# Patient Record
Sex: Male | Born: 1935 | Race: Black or African American | Hispanic: No | Marital: Married | Smoking: Never smoker
Health system: Southern US, Community
[De-identification: ages and names within clinical notes are randomized; demographics above are authoritative.]

## PROBLEM LIST (undated history)

## (undated) DIAGNOSIS — R0602 Shortness of breath: Secondary | ICD-10-CM

## (undated) DIAGNOSIS — K219 Gastro-esophageal reflux disease without esophagitis: Secondary | ICD-10-CM

## (undated) DIAGNOSIS — Z87442 Personal history of urinary calculi: Secondary | ICD-10-CM

## (undated) DIAGNOSIS — M1990 Unspecified osteoarthritis, unspecified site: Secondary | ICD-10-CM

## (undated) DIAGNOSIS — N400 Enlarged prostate without lower urinary tract symptoms: Secondary | ICD-10-CM

## (undated) DIAGNOSIS — I10 Essential (primary) hypertension: Secondary | ICD-10-CM

## (undated) DIAGNOSIS — R569 Unspecified convulsions: Secondary | ICD-10-CM

## (undated) HISTORY — PX: COLONOSCOPY: SHX174

---

## 2002-10-23 HISTORY — PX: COLON RESECTION: SHX5231

## 2006-09-18 ENCOUNTER — Ambulatory Visit: Payer: Self-pay | Admitting: Internal Medicine

## 2007-01-15 ENCOUNTER — Ambulatory Visit: Payer: Self-pay | Admitting: Internal Medicine

## 2007-04-29 ENCOUNTER — Emergency Department: Payer: Self-pay | Admitting: Emergency Medicine

## 2008-07-13 ENCOUNTER — Emergency Department: Payer: Self-pay | Admitting: Unknown Physician Specialty

## 2010-12-22 ENCOUNTER — Ambulatory Visit: Payer: Self-pay | Admitting: General Practice

## 2011-08-01 ENCOUNTER — Ambulatory Visit: Payer: Self-pay | Admitting: Radiation Oncology

## 2011-08-03 ENCOUNTER — Ambulatory Visit: Payer: Self-pay | Admitting: Radiation Oncology

## 2011-08-24 ENCOUNTER — Ambulatory Visit: Payer: Self-pay | Admitting: Radiation Oncology

## 2011-12-14 ENCOUNTER — Emergency Department: Payer: Self-pay | Admitting: Emergency Medicine

## 2013-05-22 ENCOUNTER — Emergency Department: Payer: Self-pay | Admitting: Internal Medicine

## 2013-06-10 ENCOUNTER — Ambulatory Visit: Payer: Self-pay | Admitting: Specialist

## 2013-08-20 ENCOUNTER — Other Ambulatory Visit: Payer: Self-pay | Admitting: Neurosurgery

## 2013-08-20 ENCOUNTER — Inpatient Hospital Stay
Admission: RE | Admit: 2013-08-20 | Discharge: 2013-08-20 | Disposition: A | Discharge status: Home / Self Care | Payer: Self-pay | Source: Ambulatory Visit | Attending: Neurosurgery | Admitting: Neurosurgery

## 2013-08-20 DIAGNOSIS — M549 Dorsalgia, unspecified: Secondary | ICD-10-CM

## 2013-08-21 ENCOUNTER — Ambulatory Visit
Admission: RE | Admit: 2013-08-21 | Discharge: 2013-08-21 | Disposition: A | Discharge status: Home / Self Care | Payer: Medicare Other | Source: Ambulatory Visit | Attending: Neurosurgery | Admitting: Neurosurgery

## 2013-08-21 DIAGNOSIS — M549 Dorsalgia, unspecified: Secondary | ICD-10-CM

## 2013-08-21 MED ORDER — METHYLPREDNISOLONE ACETATE 40 MG/ML INJ SUSP (RADIOLOG: 120.0000 mg | Freq: Once | INTRAMUSCULAR | Status: DC

## 2013-08-21 MED ORDER — IOHEXOL 180 MG/ML  SOLN: 1.0000 mL | Freq: Once | INTRAMUSCULAR | Status: AC | PRN

## 2013-09-26 ENCOUNTER — Other Ambulatory Visit: Payer: Self-pay | Admitting: Neurosurgery

## 2013-09-29 ENCOUNTER — Encounter (HOSPITAL_COMMUNITY): Payer: Self-pay | Admitting: *Deleted

## 2013-09-29 MED ORDER — CEFAZOLIN SODIUM-DEXTROSE 2-3 GM-% IV SOLR
2.0000 g | INTRAVENOUS | Status: AC
  Administered 2013-09-30: 2 g via INTRAVENOUS

## 2013-09-29 NOTE — Progress Notes (Addendum)
Patient will bring medications to the hospital with him- he read from a list to me .  The list did not have doses on it.  I instructed patient to take Amlodipine and Labetalol.  Pt reported that he is seen at the [facility] in [place], and has had an ECho , EKG, I sent a fax requesting any cardiac studies.

## 2013-09-29 NOTE — Progress Notes (Signed)
09/29/13 1824  OBSTRUCTIVE SLEEP APNEA  Have you ever been diagnosed with sleep apnea through a sleep study? Yes  If yes, do you have and use a CPAP or BPAP machine every night? 0  Do you snore loudly (loud enough to be heard through closed doors)?  0  Do you often feel tired, fatigued, or sleepy during the daytime? 1  Has anyone observed you stop breathing during your sleep? 0  Do you have, or are you being treated for high blood pressure? 1  BMI more than 35 kg/m2? 0  Age over 32 years old? 1  Neck circumference greater than 40 cm/18 inches? 0 (17.5)  Gender: 1  Obstructive Sleep Apnea Score 4  Score 4 or greater  Results sent to PCP

## 2013-09-30 ENCOUNTER — Inpatient Hospital Stay (HOSPITAL_COMMUNITY): Payer: Medicare Other

## 2013-09-30 ENCOUNTER — Encounter (HOSPITAL_COMMUNITY)
Admission: RE | Disposition: A | Discharge status: Home / Self Care | Payer: Self-pay | Source: Ambulatory Visit | Attending: Neurosurgery

## 2013-09-30 ENCOUNTER — Encounter (HOSPITAL_COMMUNITY): Payer: Medicare Other | Admitting: Certified Registered"

## 2013-09-30 ENCOUNTER — Observation Stay (HOSPITAL_COMMUNITY)
Admission: RE | Admit: 2013-09-30 | Discharge: 2013-09-30 | Disposition: A | Discharge status: Home / Self Care | Payer: Medicare Other | Source: Ambulatory Visit | Attending: Neurosurgery | Admitting: Neurosurgery

## 2013-09-30 ENCOUNTER — Inpatient Hospital Stay (HOSPITAL_COMMUNITY): Payer: Medicare Other | Admitting: Certified Registered"

## 2013-09-30 ENCOUNTER — Encounter (HOSPITAL_COMMUNITY): Payer: Self-pay | Admitting: Certified Registered"

## 2013-09-30 DIAGNOSIS — I10 Essential (primary) hypertension: Secondary | ICD-10-CM | POA: Insufficient documentation

## 2013-09-30 DIAGNOSIS — M7130 Other bursal cyst, unspecified site: Secondary | ICD-10-CM | POA: Insufficient documentation

## 2013-09-30 DIAGNOSIS — M129 Arthropathy, unspecified: Secondary | ICD-10-CM | POA: Insufficient documentation

## 2013-09-30 DIAGNOSIS — M47817 Spondylosis without myelopathy or radiculopathy, lumbosacral region: Principal | ICD-10-CM | POA: Insufficient documentation

## 2013-09-30 DIAGNOSIS — N400 Enlarged prostate without lower urinary tract symptoms: Secondary | ICD-10-CM | POA: Insufficient documentation

## 2013-09-30 DIAGNOSIS — M48062 Spinal stenosis, lumbar region with neurogenic claudication: Secondary | ICD-10-CM | POA: Diagnosis present

## 2013-09-30 DIAGNOSIS — K219 Gastro-esophageal reflux disease without esophagitis: Secondary | ICD-10-CM | POA: Insufficient documentation

## 2013-09-30 HISTORY — PX: LUMBAR LAMINECTOMY/DECOMPRESSION MICRODISCECTOMY: SHX5026

## 2013-09-30 HISTORY — DX: Gastro-esophageal reflux disease without esophagitis: K21.9

## 2013-09-30 HISTORY — DX: Shortness of breath: R06.02

## 2013-09-30 HISTORY — DX: Unspecified convulsions: R56.9

## 2013-09-30 HISTORY — DX: Benign prostatic hyperplasia without lower urinary tract symptoms: N40.0

## 2013-09-30 HISTORY — DX: Personal history of urinary calculi: Z87.442

## 2013-09-30 HISTORY — DX: Unspecified osteoarthritis, unspecified site: M19.90

## 2013-09-30 HISTORY — DX: Essential (primary) hypertension: I10

## 2013-09-30 LAB — CBC WITH DIFFERENTIAL/PLATELET
Basophils Absolute: 0 10*3/uL (ref 0.0–0.1)
Basophils Relative: 0 % (ref 0–1)
Eosinophils Absolute: 0.1 10*3/uL (ref 0.0–0.7)
Eosinophils Relative: 1 % (ref 0–5)
HCT: 38.7 % — ABNORMAL LOW (ref 39.0–52.0)
Lymphocytes Relative: 22 % (ref 12–46)
MCH: 32 pg (ref 26.0–34.0)
MCHC: 34.9 g/dL (ref 30.0–36.0)
MCV: 91.7 fL (ref 78.0–100.0)
Monocytes Absolute: 0.6 10*3/uL (ref 0.1–1.0)
Platelets: 254 10*3/uL (ref 150–400)
RBC: 4.22 MIL/uL (ref 4.22–5.81)
RDW: 14.2 % (ref 11.5–15.5)
WBC: 6.4 10*3/uL (ref 4.0–10.5)

## 2013-09-30 LAB — BASIC METABOLIC PANEL
BUN: 23 mg/dL (ref 6–23)
CO2: 27 mEq/L (ref 19–32)
Calcium: 9.6 mg/dL (ref 8.4–10.5)
Chloride: 103 mEq/L (ref 96–112)
Creatinine, Ser: 1.05 mg/dL (ref 0.50–1.35)
Sodium: 142 mEq/L (ref 135–145)

## 2013-09-30 LAB — SURGICAL PCR SCREEN: MRSA, PCR: NEGATIVE

## 2013-09-30 SURGERY — LUMBAR LAMINECTOMY/DECOMPRESSION MICRODISCECTOMY 1 LEVEL
Anesthesia: General | Site: Spine Lumbar | Laterality: Right

## 2013-09-30 MED ORDER — GLYCOPYRROLATE 0.2 MG/ML IJ SOLN
INTRAMUSCULAR | Status: DC | PRN
  Administered 2013-09-30: 0.4 mg via INTRAVENOUS

## 2013-09-30 MED ORDER — OXYCODONE-ACETAMINOPHEN 5-325 MG PO TABS: 1.0000 | ORAL_TABLET | ORAL | Status: DC | PRN

## 2013-09-30 MED ORDER — LABETALOL HCL 200 MG PO TABS
200.0000 mg | ORAL_TABLET | Freq: Two times a day (BID) | ORAL | Status: DC
  Filled 2013-09-30: qty 1

## 2013-09-30 MED ORDER — FENTANYL CITRATE 0.05 MG/ML IJ SOLN
INTRAMUSCULAR | Status: DC | PRN
  Administered 2013-09-30: 150 ug via INTRAVENOUS

## 2013-09-30 MED ORDER — LIDOCAINE HCL (CARDIAC) 20 MG/ML IV SOLN
INTRAVENOUS | Status: DC | PRN
  Administered 2013-09-30: 80 mg via INTRATRACHEAL
  Administered 2013-09-30: 100 mg via INTRAVENOUS

## 2013-09-30 MED ORDER — ACETAMINOPHEN 650 MG RE SUPP: 650.0000 mg | RECTAL | Status: DC | PRN

## 2013-09-30 MED ORDER — HYDROCODONE-ACETAMINOPHEN 5-325 MG PO TABS
1.0000 | ORAL_TABLET | ORAL | Status: DC | PRN
  Administered 2013-09-30: 2 via ORAL
  Filled 2013-09-30: qty 2

## 2013-09-30 MED ORDER — SODIUM CHLORIDE 0.9 % IJ SOLN: 3.0000 mL | INTRAMUSCULAR | Status: DC | PRN

## 2013-09-30 MED ORDER — CEFAZOLIN SODIUM 1-5 GM-% IV SOLN
1.0000 g | Freq: Three times a day (TID) | INTRAVENOUS | Status: DC
  Administered 2013-09-30: 1 g via INTRAVENOUS
  Filled 2013-09-30 (×2): qty 50

## 2013-09-30 MED ORDER — ASPIRIN EC 81 MG PO TBEC
81.0000 mg | DELAYED_RELEASE_TABLET | Freq: Every day | ORAL | Status: DC
  Filled 2013-09-30: qty 1

## 2013-09-30 MED ORDER — ASPIRIN 81 MG PO TABS: 81.0000 mg | ORAL_TABLET | Freq: Every day | ORAL | Status: DC

## 2013-09-30 MED ORDER — SODIUM CHLORIDE 0.9 % IR SOLN
Status: DC | PRN
  Administered 2013-09-30: 11:00:00

## 2013-09-30 MED ORDER — AMLODIPINE BESYLATE 10 MG PO TABS: 10.0000 mg | ORAL_TABLET | Freq: Every day | ORAL | Status: DC

## 2013-09-30 MED ORDER — POTASSIUM CHLORIDE 10 MEQ/100ML IV SOLN
10.0000 meq | INTRAVENOUS | Status: AC
  Administered 2013-09-30 (×3): 10 meq via INTRAVENOUS
  Filled 2013-09-30: qty 100

## 2013-09-30 MED ORDER — CEFAZOLIN SODIUM-DEXTROSE 2-3 GM-% IV SOLR
INTRAVENOUS | Status: AC
  Filled 2013-09-30: qty 50

## 2013-09-30 MED ORDER — MUPIROCIN 2 % EX OINT
TOPICAL_OINTMENT | CUTANEOUS | Status: AC
  Administered 2013-09-30: 08:00:00 via NASAL
  Filled 2013-09-30: qty 22

## 2013-09-30 MED ORDER — ONDANSETRON HCL 4 MG/2ML IJ SOLN
INTRAMUSCULAR | Status: DC | PRN
  Administered 2013-09-30: 4 mg via INTRAVENOUS

## 2013-09-30 MED ORDER — SODIUM CHLORIDE 0.9 % IJ SOLN
3.0000 mL | Freq: Two times a day (BID) | INTRAMUSCULAR | Status: DC
  Administered 2013-09-30: 3 mL via INTRAVENOUS

## 2013-09-30 MED ORDER — LABETALOL HCL 5 MG/ML IV SOLN
INTRAVENOUS | Status: AC
  Administered 2013-09-30: 10 mg via INTRAVENOUS
  Filled 2013-09-30: qty 4

## 2013-09-30 MED ORDER — DEXAMETHASONE SODIUM PHOSPHATE 10 MG/ML IJ SOLN
INTRAMUSCULAR | Status: AC
  Filled 2013-09-30: qty 1

## 2013-09-30 MED ORDER — KETOROLAC TROMETHAMINE 30 MG/ML IJ SOLN
INTRAMUSCULAR | Status: DC | PRN
  Administered 2013-09-30: 15 mg via INTRAVENOUS

## 2013-09-30 MED ORDER — ARTIFICIAL TEARS OP OINT
TOPICAL_OINTMENT | OPHTHALMIC | Status: DC | PRN
  Administered 2013-09-30: 1 via OPHTHALMIC

## 2013-09-30 MED ORDER — HYDROMORPHONE HCL PF 1 MG/ML IJ SOLN
0.2500 mg | INTRAMUSCULAR | Status: DC | PRN
  Administered 2013-09-30 (×2): 0.5 mg via INTRAVENOUS

## 2013-09-30 MED ORDER — ONDANSETRON HCL 4 MG/2ML IJ SOLN: 4.0000 mg | INTRAMUSCULAR | Status: DC | PRN

## 2013-09-30 MED ORDER — CYCLOBENZAPRINE HCL 10 MG PO TABS: 10.0000 mg | ORAL_TABLET | Freq: Three times a day (TID) | ORAL | Status: DC | PRN

## 2013-09-30 MED ORDER — THROMBIN 5000 UNITS EX SOLR
CUTANEOUS | Status: DC | PRN
  Administered 2013-09-30 (×2): 5000 [IU] via TOPICAL

## 2013-09-30 MED ORDER — MENTHOL 3 MG MT LOZG: 1.0000 | LOZENGE | OROMUCOSAL | Status: DC | PRN

## 2013-09-30 MED ORDER — POTASSIUM CHLORIDE 10 MEQ/100ML IV SOLN
10.0000 meq | INTRAVENOUS | Status: DC
  Administered 2013-09-30: 10 meq via INTRAVENOUS
  Filled 2013-09-30: qty 100

## 2013-09-30 MED ORDER — ALUM & MAG HYDROXIDE-SIMETH 200-200-20 MG/5ML PO SUSP: 30.0000 mL | Freq: Four times a day (QID) | ORAL | Status: DC | PRN

## 2013-09-30 MED ORDER — HEMOSTATIC AGENTS (NO CHARGE) OPTIME
TOPICAL | Status: DC | PRN
  Administered 2013-09-30: 1 via TOPICAL

## 2013-09-30 MED ORDER — SIMVASTATIN 40 MG PO TABS
40.0000 mg | ORAL_TABLET | Freq: Every day | ORAL | Status: DC
  Administered 2013-09-30: 40 mg via ORAL
  Filled 2013-09-30: qty 1

## 2013-09-30 MED ORDER — NEOSTIGMINE METHYLSULFATE 1 MG/ML IJ SOLN
INTRAMUSCULAR | Status: DC | PRN
  Administered 2013-09-30: 3 mg via INTRAVENOUS

## 2013-09-30 MED ORDER — FAMOTIDINE 20 MG PO TABS
20.0000 mg | ORAL_TABLET | Freq: Two times a day (BID) | ORAL | Status: DC
  Administered 2013-09-30: 20 mg via ORAL
  Filled 2013-09-30 (×2): qty 1

## 2013-09-30 MED ORDER — SENNA 8.6 MG PO TABS
1.0000 | ORAL_TABLET | Freq: Two times a day (BID) | ORAL | Status: DC
  Administered 2013-09-30: 8.6 mg via ORAL
  Filled 2013-09-30: qty 1

## 2013-09-30 MED ORDER — ONDANSETRON HCL 4 MG/2ML IJ SOLN: 4.0000 mg | Freq: Once | INTRAMUSCULAR | Status: DC | PRN

## 2013-09-30 MED ORDER — ACETAMINOPHEN 325 MG PO TABS: 650.0000 mg | ORAL_TABLET | ORAL | Status: DC | PRN

## 2013-09-30 MED ORDER — HYDROMORPHONE HCL PF 1 MG/ML IJ SOLN
INTRAMUSCULAR | Status: AC
  Filled 2013-09-30: qty 1

## 2013-09-30 MED ORDER — 0.9 % SODIUM CHLORIDE (POUR BTL) OPTIME
TOPICAL | Status: DC | PRN
  Administered 2013-09-30: 1000 mL

## 2013-09-30 MED ORDER — KETOROLAC TROMETHAMINE 30 MG/ML IJ SOLN
30.0000 mg | Freq: Four times a day (QID) | INTRAMUSCULAR | Status: DC
  Administered 2013-09-30: 30 mg via INTRAVENOUS

## 2013-09-30 MED ORDER — BUPIVACAINE HCL (PF) 0.25 % IJ SOLN
INTRAMUSCULAR | Status: DC | PRN
  Administered 2013-09-30: 17 mL

## 2013-09-30 MED ORDER — DEXAMETHASONE SODIUM PHOSPHATE 10 MG/ML IJ SOLN
10.0000 mg | INTRAMUSCULAR | Status: AC
  Administered 2013-09-30: 10 mg via INTRAVENOUS

## 2013-09-30 MED ORDER — POTASSIUM CHLORIDE 10 MEQ/100ML IV SOLN
INTRAVENOUS | Status: AC
  Filled 2013-09-30: qty 200

## 2013-09-30 MED ORDER — LACTATED RINGERS IV SOLN
INTRAVENOUS | Status: DC
  Administered 2013-09-30 (×2): via INTRAVENOUS

## 2013-09-30 MED ORDER — PROPOFOL 10 MG/ML IV BOLUS
INTRAVENOUS | Status: DC | PRN
  Administered 2013-09-30: 300 mg via INTRAVENOUS

## 2013-09-30 MED ORDER — PHENOL 1.4 % MT LIQD: 1.0000 | OROMUCOSAL | Status: DC | PRN

## 2013-09-30 MED ORDER — TRIAMTERENE-HCTZ 37.5-25 MG PO TABS
1.0000 | ORAL_TABLET | Freq: Every day | ORAL | Status: DC
  Administered 2013-09-30: 1 via ORAL
  Filled 2013-09-30: qty 1

## 2013-09-30 MED ORDER — HYDROMORPHONE HCL PF 1 MG/ML IJ SOLN: 0.5000 mg | INTRAMUSCULAR | Status: DC | PRN

## 2013-09-30 MED ORDER — ROCURONIUM BROMIDE 100 MG/10ML IV SOLN
INTRAVENOUS | Status: DC | PRN
  Administered 2013-09-30: 50 mg via INTRAVENOUS

## 2013-09-30 SURGICAL SUPPLY — 54 items
BAG DECANTER FOR FLEXI CONT (MISCELLANEOUS) ×2 IMPLANT
BENZOIN TINCTURE PRP APPL 2/3 (GAUZE/BANDAGES/DRESSINGS) ×2 IMPLANT
BLADE SURG ROTATE 9660 (MISCELLANEOUS) ×2 IMPLANT
BRUSH SCRUB EZ PLAIN DRY (MISCELLANEOUS) ×2 IMPLANT
BUR CUTTER 7.0 ROUND (BURR) ×2 IMPLANT
CANISTER SUCT 3000ML (MISCELLANEOUS) ×2 IMPLANT
CONT SPEC 4OZ CLIKSEAL STRL BL (MISCELLANEOUS) ×2 IMPLANT
DECANTER SPIKE VIAL GLASS SM (MISCELLANEOUS) IMPLANT
DERMABOND ADHESIVE PROPEN (GAUZE/BANDAGES/DRESSINGS) ×1
DERMABOND ADVANCED (GAUZE/BANDAGES/DRESSINGS)
DERMABOND ADVANCED .7 DNX12 (GAUZE/BANDAGES/DRESSINGS) IMPLANT
DERMABOND ADVANCED .7 DNX6 (GAUZE/BANDAGES/DRESSINGS) ×1 IMPLANT
DRAPE LAPAROTOMY 100X72X124 (DRAPES) ×2 IMPLANT
DRAPE MICROSCOPE LEICA (MISCELLANEOUS) ×2 IMPLANT
DRAPE MICROSCOPE ZEISS OPMI (DRAPES) IMPLANT
DRAPE POUCH INSTRU U-SHP 10X18 (DRAPES) ×2 IMPLANT
DRAPE PROXIMA HALF (DRAPES) IMPLANT
DRAPE SURG 17X23 STRL (DRAPES) ×4 IMPLANT
DURAPREP 26ML APPLICATOR (WOUND CARE) ×2 IMPLANT
ELECT REM PT RETURN 9FT ADLT (ELECTROSURGICAL) ×2
ELECTRODE REM PT RTRN 9FT ADLT (ELECTROSURGICAL) ×1 IMPLANT
GAUZE SPONGE 4X4 16PLY XRAY LF (GAUZE/BANDAGES/DRESSINGS) IMPLANT
GLOVE BIOGEL PI IND STRL 7.0 (GLOVE) ×1 IMPLANT
GLOVE BIOGEL PI IND STRL 7.5 (GLOVE) ×2 IMPLANT
GLOVE BIOGEL PI INDICATOR 7.0 (GLOVE) ×1
GLOVE BIOGEL PI INDICATOR 7.5 (GLOVE) ×2
GLOVE ECLIPSE 7.0 STRL STRAW (GLOVE) ×2 IMPLANT
GLOVE ECLIPSE 8.5 STRL (GLOVE) ×2 IMPLANT
GLOVE EXAM NITRILE LRG STRL (GLOVE) IMPLANT
GLOVE EXAM NITRILE MD LF STRL (GLOVE) IMPLANT
GLOVE EXAM NITRILE XL STR (GLOVE) IMPLANT
GLOVE EXAM NITRILE XS STR PU (GLOVE) IMPLANT
GLOVE SURG SS PI 7.0 STRL IVOR (GLOVE) ×4 IMPLANT
GOWN BRE IMP SLV AUR LG STRL (GOWN DISPOSABLE) ×2 IMPLANT
GOWN BRE IMP SLV AUR XL STRL (GOWN DISPOSABLE) ×4 IMPLANT
GOWN STRL REIN 2XL LVL4 (GOWN DISPOSABLE) IMPLANT
KIT BASIN OR (CUSTOM PROCEDURE TRAY) ×2 IMPLANT
KIT ROOM TURNOVER OR (KITS) ×2 IMPLANT
NEEDLE HYPO 22GX1.5 SAFETY (NEEDLE) ×2 IMPLANT
NEEDLE SPNL 22GX3.5 QUINCKE BK (NEEDLE) ×2 IMPLANT
NS IRRIG 1000ML POUR BTL (IV SOLUTION) ×2 IMPLANT
PACK LAMINECTOMY NEURO (CUSTOM PROCEDURE TRAY) ×2 IMPLANT
PAD ARMBOARD 7.5X6 YLW CONV (MISCELLANEOUS) ×10 IMPLANT
RUBBERBAND STERILE (MISCELLANEOUS) ×4 IMPLANT
SPONGE GAUZE 4X4 12PLY (GAUZE/BANDAGES/DRESSINGS) ×2 IMPLANT
SPONGE SURGIFOAM ABS GEL SZ50 (HEMOSTASIS) ×2 IMPLANT
STRIP CLOSURE SKIN 1/2X4 (GAUZE/BANDAGES/DRESSINGS) ×2 IMPLANT
SUT VIC AB 2-0 CT1 18 (SUTURE) ×2 IMPLANT
SUT VIC AB 3-0 SH 8-18 (SUTURE) ×2 IMPLANT
SYR 20ML ECCENTRIC (SYRINGE) ×2 IMPLANT
TAPE CLOTH SURG 4X10 WHT LF (GAUZE/BANDAGES/DRESSINGS) ×2 IMPLANT
TOWEL OR 17X24 6PK STRL BLUE (TOWEL DISPOSABLE) ×2 IMPLANT
TOWEL OR 17X26 10 PK STRL BLUE (TOWEL DISPOSABLE) ×2 IMPLANT
WATER STERILE IRR 1000ML POUR (IV SOLUTION) ×2 IMPLANT

## 2013-09-30 NOTE — Anesthesia Preprocedure Evaluation (Signed)
Anesthesia Evaluation  Patient identified by MRN, date of birth, ID band Patient awake    Reviewed: Allergy & Precautions, H&P , NPO status , Patient's Chart, lab work & pertinent test results  Airway       Dental   Pulmonary          Cardiovascular hypertension,     Neuro/Psych Seizures -,     GI/Hepatic GERD-  ,  Endo/Other    Renal/GU Renal disease     Musculoskeletal   Abdominal   Peds  Hematology   Anesthesia Other Findings   Reproductive/Obstetrics                           Anesthesia Physical Anesthesia Plan  ASA: II  Anesthesia Plan: General   Post-op Pain Management:    Induction: Intravenous  Airway Management Planned: Oral ETT  Additional Equipment:   Intra-op Plan:   Post-operative Plan: Extubation in OR  Informed Consent: I have reviewed the patients History and Physical, chart, labs and discussed the procedure including the risks, benefits and alternatives for the proposed anesthesia with the patient or authorized representative who has indicated his/her understanding and acceptance.     Plan Discussed with:   Anesthesia Plan Comments:         Anesthesia Quick Evaluation

## 2013-09-30 NOTE — Anesthesia Procedure Notes (Signed)
Procedure Name: Intubation Date/Time: 09/30/2013 9:52 AM Performed by: Jerilee Hoh Pre-anesthesia Checklist: Emergency Drugs available, Patient identified, Suction available and Patient being monitored Patient Re-evaluated:Patient Re-evaluated prior to inductionOxygen Delivery Method: Circle system utilized Preoxygenation: Pre-oxygenation with 100% oxygen Intubation Type: IV induction Ventilation: Mask ventilation without difficulty Laryngoscope Size: Mac and 4 Grade View: Grade III Tube type: Oral Tube size: 7.5 mm Number of attempts: 2 Airway Equipment and Method: Bougie stylet and LTA kit utilized Placement Confirmation: ETT inserted through vocal cords under direct vision,  positive ETCO2 and breath sounds checked- equal and bilateral Secured at: 22 cm Tube secured with: Tape Dental Injury: Teeth and Oropharynx as per pre-operative assessment  Comments: Easy mask airway. DL with MAC 4 blade, grade III view, attempted to place ETT, esophageal intubation immediately recognized. DL with MAC 4, bougie passed without difficulty, tracheal rings felt, guided ETT over bougie atraumatically. +EtCO2, +BBS

## 2013-09-30 NOTE — Plan of Care (Signed)
Problem: Consults Goal: Diagnosis - Spinal Surgery Outcome: Completed/Met Date Met:  09/30/13 Lumbar Laminectomy (Complex)     

## 2013-09-30 NOTE — Transfer of Care (Signed)
Immediate Anesthesia Transfer of Care Note  Patient: Mark Gonzales  Procedure(s) Performed: Procedure(s) with comments: RIGHT LUMBAR  THREE-FOUR LAMINECTOMY (Right) - RIGHT   Patient Location: PACU  Anesthesia Type:General  Level of Consciousness: sedated, patient cooperative and responds to stimulation  Airway & Oxygen Therapy: Patient Spontanous Breathing and Patient connected to face mask oxygen  Post-op Assessment: Report given to PACU RN, Post -op Vital signs reviewed and stable and Patient moving all extremities  Post vital signs: Reviewed and stable  Complications: No apparent anesthesia complications

## 2013-09-30 NOTE — H&P (Signed)
Mark Gonzales is an 79 y.o. male.   Chief Complaint:  Right leg pain HPI: The patient is a 79 year old male with severe right lower extremity pain procedures and weakness consistent with a mixed lumbar radiculopathy. Workup demonstrates evidence of spondylosis and stenosis at L3-4 causing compression the exiting L3 and L4 nerve roots. Patient has failed conservative management. He presents now for right-sided L3 for decompressive surgery. Past Medical History  Diagnosis Date  . BPH (benign prostatic hyperplasia)   . Hypertension   . Shortness of breath   . Seizures     2002 approx.. none since  . History of kidney stones   . GERD (gastroesophageal reflux disease)   . Arthritis     Past Surgical History  Procedure Laterality Date  . Colonoscopy    . Colon resection  2004    History reviewed. No pertinent family history. Social History:  reports that he has never smoked. He does not have any smokeless tobacco history on file. He reports that he does not drink alcohol or use illicit drugs.  Allergies:  Allergies  Allergen Reactions  . Tamsulosin Hcl     Swelling in lips    Medications Prior to Admission  Medication Sig Dispense Refill  . acetaminophen (TYLENOL) 500 MG tablet Take 500 mg by mouth every 6 (six) hours as needed.      Marland Kitchen amLODipine (NORVASC) 10 MG tablet Take 10 mg by mouth daily.      Marland Kitchen aspirin 81 MG tablet Take 81 mg by mouth daily.      Marland Kitchen ibuprofen (ADVIL,MOTRIN) 200 MG tablet Take 200 mg by mouth every 6 (six) hours as needed for mild pain.      Marland Kitchen labetalol (NORMODYNE) 200 MG tablet Take 200 mg by mouth 2 (two) times daily.      . ranitidine (ZANTAC) 150 MG tablet Take 150 mg by mouth 2 (two) times daily.      . simvastatin (ZOCOR) 40 MG tablet Take 40 mg by mouth daily.      Marland Kitchen triamterene-hydrochlorothiazide (MAXZIDE-25) 37.5-25 MG per tablet Take 1 tablet by mouth daily.        Results for orders placed during the hospital encounter of 09/30/13 (from the  past 48 hour(s))  BASIC METABOLIC PANEL     Status: Abnormal   Collection Time    09/30/13  7:54 AM      Result Value Range   Sodium 142  135 - 145 mEq/L   Potassium 2.8 (*) 3.5 - 5.1 mEq/L   Chloride 103  96 - 112 mEq/L   CO2 27  19 - 32 mEq/L   Glucose, Bld 102 (*) 70 - 99 mg/dL   BUN 23  6 - 23 mg/dL   Creatinine, Ser 4.09  0.50 - 1.35 mg/dL   Calcium 9.6  8.4 - 81.1 mg/dL   GFR calc non Af Amer 66 (*) >90 mL/min   GFR calc Af Amer 77 (*) >90 mL/min   Comment: (NOTE)     The eGFR has been calculated using the CKD EPI equation.     This calculation has not been validated in all clinical situations.     eGFR's persistently <90 mL/min signify possible Chronic Kidney     Disease.  CBC WITH DIFFERENTIAL     Status: Abnormal   Collection Time    09/30/13  7:54 AM      Result Value Range   WBC 6.4  4.0 - 10.5 K/uL   RBC  4.22  4.22 - 5.81 MIL/uL   Hemoglobin 13.5  13.0 - 17.0 g/dL   HCT 16.1 (*) 09.6 - 04.5 %   MCV 91.7  78.0 - 100.0 fL   MCH 32.0  26.0 - 34.0 pg   MCHC 34.9  30.0 - 36.0 g/dL   RDW 40.9  81.1 - 91.4 %   Platelets 254  150 - 400 K/uL   Neutrophils Relative % 68  43 - 77 %   Neutro Abs 4.3  1.7 - 7.7 K/uL   Lymphocytes Relative 22  12 - 46 %   Lymphs Abs 1.4  0.7 - 4.0 K/uL   Monocytes Relative 9  3 - 12 %   Monocytes Absolute 0.6  0.1 - 1.0 K/uL   Eosinophils Relative 1  0 - 5 %   Eosinophils Absolute 0.1  0.0 - 0.7 K/uL   Basophils Relative 0  0 - 1 %   Basophils Absolute 0.0  0.0 - 0.1 K/uL   No results found.  Review of Systems  Constitutional: Negative.   HENT: Negative.   Eyes: Negative.   Respiratory: Negative.   Cardiovascular: Negative.   Gastrointestinal: Negative.   Genitourinary: Negative.   Musculoskeletal: Negative.   Skin: Negative.   Neurological: Negative.   Endo/Heme/Allergies: Negative.   Psychiatric/Behavioral: Negative.     Blood pressure 174/83, pulse 77, temperature 98.1 F (36.7 C), temperature source Oral, resp. rate 20,  height 5\' 7"  (1.702 m), weight 89.812 kg (198 lb), SpO2 100.00%. Physical Exam  Constitutional: He is oriented to person, place, and time. He appears well-developed and well-nourished. No distress.  HENT:  Head: Normocephalic and atraumatic.  Right Ear: External ear normal.  Left Ear: External ear normal.  Nose: Nose normal.  Mouth/Throat: Oropharynx is clear and moist.  Eyes: Conjunctivae and EOM are normal. Pupils are equal, round, and reactive to light.  Neck: Normal range of motion. Neck supple. No tracheal deviation present. No thyromegaly present.  Cardiovascular: Normal rate, regular rhythm, normal heart sounds and intact distal pulses.  Exam reveals no friction rub.   No murmur heard. Respiratory: Effort normal and breath sounds normal. No respiratory distress. He has no wheezes.  GI: Soft. Bowel sounds are normal. He exhibits no distension. There is no tenderness.  Musculoskeletal: Normal range of motion. He exhibits no edema and no tenderness.  Neurological: He is alert and oriented to person, place, and time. He has normal reflexes. No cranial nerve deficit. Coordination normal.  Skin: Skin is warm and dry. No rash noted. He is not diaphoretic. No erythema. No pallor.  Psychiatric: He has a normal mood and affect. His behavior is normal. Judgment and thought content normal.     Assessment/Plan Right L3-4 stenosis with radiculopathy. Plan right L3 for decompressive laminotomy and right L3 and L4 decompressive foraminotomies. Risks and benefits have been explained. Patient wishes to proceed.  Jenna Ardoin A 09/30/2013, 9:22 AM

## 2013-09-30 NOTE — Discharge Summary (Signed)
Physician Discharge Summary  Patient ID: Mark Gonzales MRN: [id number] DOB/AGE: [birth year] 79 y.o.  Admit date: 09/30/2013 Discharge date: 09/30/2013  Admission Diagnoses:  Discharge Diagnoses:  Principal Problem:   Spinal stenosis, lumbar region, with neurogenic claudication Active Problems:   Lumbar stenosis with neurogenic claudication   Discharged Condition: good  Hospital Course: Patient admitted to the hospital where he underwent uncomplicated right-sided L3 for laminotomy and resection of synovial cyst. Postoperatively he is doing very well peer back and lower extremity pain much better. Strength cessation intact. Ready for discharge home.  Consults:   Significant Diagnostic Studies:   Treatments:   Discharge Exam: Blood pressure 171/91, pulse 88, temperature 97.7 F (36.5 C), temperature source Oral, resp. rate 20, height 5\' 7"  (1.702 m), weight 89.812 kg (198 lb), SpO2 96.00%. Awake and alert. Oriented and appropriate. Motor sensory function intact. Wound clean and dry. Chest and abdomen benign.  Disposition: Final discharge disposition not confirmed     Medication List         acetaminophen 500 MG tablet  Commonly known as:  TYLENOL  Take 500 mg by mouth every 6 (six) hours as needed.     amLODipine 10 MG tablet  Commonly known as:  NORVASC  Take 10 mg by mouth daily.     aspirin 81 MG tablet  Take 81 mg by mouth daily.     ibuprofen 200 MG tablet  Commonly known as:  ADVIL,MOTRIN  Take 200 mg by mouth every 6 (six) hours as needed for mild pain.     labetalol 200 MG tablet  Commonly known as:  NORMODYNE  Take 200 mg by mouth 2 (two) times daily.     ranitidine 150 MG tablet  Commonly known as:  ZANTAC  Take 150 mg by mouth 2 (two) times daily.     simvastatin 40 MG tablet  Commonly known as:  ZOCOR  Take 40 mg by mouth daily.     triamterene-hydrochlorothiazide 37.5-25 MG per tablet  Commonly known as:  MAXZIDE-25  Take 1 tablet by mouth  daily.         Signed: Kashena Novitski A 09/30/2013, 5:32 PM

## 2013-09-30 NOTE — Brief Op Note (Signed)
09/30/2013  10:53 AM  PATIENT:  Mark Gonzales  79 y.o. male  PRE-OPERATIVE DIAGNOSIS:  stenosis  POST-OPERATIVE DIAGNOSIS:  STENOSIS  PROCEDURE:  Procedure(s) with comments: RIGHT LUMBAR  THREE-FOUR LAMINECTOMY (Right) - RIGHT   SURGEON:  Surgeon(s) and Role:    * Temple Pacini, MD - Primary    * Lisbeth Renshaw, MD - Assisting  PHYSICIAN ASSISTANT:   ASSISTANTS:    ANESTHESIA:   general  EBL:  Total I/O In: 1000 [I.V.:1000] Out: 50 [Blood:50]  BLOOD ADMINISTERED:none  DRAINS: none   LOCAL MEDICATIONS USED:  MARCAINE     SPECIMEN:  No Specimen  DISPOSITION OF SPECIMEN:  N/A  COUNTS:  YES  TOURNIQUET:  * No tourniquets in log *  DICTATION: .Dragon Dictation  PLAN OF CARE: Admit for overnight observation  PATIENT DISPOSITION:  PACU - hemodynamically stable.   Delay start of Pharmacological VTE agent (>24hrs) due to surgical blood loss or risk of bleeding: yes

## 2013-09-30 NOTE — Progress Notes (Signed)
Requested cardiac stress test, any cardiac records from [facility] (cardiology Dr. Juliann Pares).  Office stated they did not see in computer and was proboably in storage.  If  Located they will fax.

## 2013-09-30 NOTE — Op Note (Signed)
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 Resection of right L3-4 synovial cyst which was densely adherent to the dura. Microdissection.  Surgeon:Raima Geathers A.Andreah Goheen, M.D.  Asst. Surgeon: Conchita Paris  Anesthesia: General  Indication: 79 year old male with severe right lower extremity pain. Using weakness consistent with a mixed lumbar radiculopathy. Workup demonstrates evidence of significant spondylosis on the right at L3-4 with a probable right-sided L3-4 synovial cyst causing marked compression of the right L4 nerve root. Patient has failed conservative management and presents now for decompressive surgery.  Operative note: After induction anesthesia, patient positioned prone onto Wilson frame and appropriately padded. Lumbar region prepped and draped. Incision made overlying the L3-4 interspace. This carried down sharply in the midline. Supper off Henderson Newcomer performed the right side. The lamina and facet joints dissected free. Retractor placed. X-ray taken. Level confirmed. Laminotomy performed using high-speed drill and Kerrison rongeurs. Underlying thecal sac identified. Ligamentum flavum elevated and resected piecemeal fashion. Microscope brought field these might dissection of the spinal canal and synovial cyst. Densely adherent synovial cyst was encountered laterally and dorsally to the right L4 nerve root. This is dissected free and resected completely. Foraminotomies were then performed on of course exiting L3 and L4 nerve roots. This point a very thorough decompression had been achieved. There is no his injury to thecal sac or nerve roots. Wound is then irrigated without like solution. Gelfoam was placed topically for hemostasis. Microscope and  retractor system were removed. Hemostasis muscle achieved with hardware was and close in layers with Vicryl sutures. Steri-Strips and sterile dressing were applied. There were no apparent complications. Patient tolerated the procedure well and he returns to the recovery room postop.

## 2013-09-30 NOTE — Anesthesia Postprocedure Evaluation (Signed)
  Anesthesia Post-op Note  Patient: Mark Gonzales  Procedure(s) Performed: Procedure(s) with comments: RIGHT LUMBAR  THREE-FOUR LAMINECTOMY (Right) - RIGHT   Patient Location: PACU  Anesthesia Type:General  Level of Consciousness: awake, alert , oriented and patient cooperative  Airway and Oxygen Therapy: Patient Spontanous Breathing  Post-op Pain: mild  Post-op Assessment: Post-op Vital signs reviewed, Patient's Cardiovascular Status Stable, Respiratory Function Stable, Patent Airway, No signs of Nausea or vomiting and Pain level controlled  Post-op Vital Signs: stable  Complications: No apparent anesthesia complications

## 2013-09-30 NOTE — Preoperative (Signed)
Beta Blockers   Reason not to administer Beta Blockers:Not Applicable 

## 2013-10-02 ENCOUNTER — Encounter (HOSPITAL_COMMUNITY): Payer: Self-pay | Admitting: Neurosurgery

## 2018-07-20 ENCOUNTER — Encounter: Payer: Self-pay | Admitting: Emergency Medicine

## 2018-07-20 ENCOUNTER — Emergency Department
Admission: EM | Admit: 2018-07-20 | Discharge: 2018-07-21 | Disposition: A | Discharge status: Home / Self Care | Payer: No Typology Code available for payment source | Attending: Student in an Organized Health Care Education/Training Program | Admitting: Student in an Organized Health Care Education/Training Program

## 2018-07-20 ENCOUNTER — Other Ambulatory Visit: Payer: Self-pay

## 2018-07-20 ENCOUNTER — Emergency Department: Payer: No Typology Code available for payment source

## 2018-07-20 DIAGNOSIS — Y999 Unspecified external cause status: Secondary | ICD-10-CM | POA: Insufficient documentation

## 2018-07-20 DIAGNOSIS — S199XXA Unspecified injury of neck, initial encounter: Secondary | ICD-10-CM | POA: Diagnosis present

## 2018-07-20 DIAGNOSIS — Z7982 Long term (current) use of aspirin: Secondary | ICD-10-CM | POA: Insufficient documentation

## 2018-07-20 DIAGNOSIS — S161XXA Strain of muscle, fascia and tendon at neck level, initial encounter: Secondary | ICD-10-CM | POA: Diagnosis not present

## 2018-07-20 DIAGNOSIS — R51 Headache: Secondary | ICD-10-CM | POA: Insufficient documentation

## 2018-07-20 DIAGNOSIS — Z79899 Other long term (current) drug therapy: Secondary | ICD-10-CM | POA: Insufficient documentation

## 2018-07-20 DIAGNOSIS — Y9389 Activity, other specified: Secondary | ICD-10-CM | POA: Diagnosis not present

## 2018-07-20 DIAGNOSIS — Y92410 Unspecified street and highway as the place of occurrence of the external cause: Secondary | ICD-10-CM | POA: Insufficient documentation

## 2018-07-20 DIAGNOSIS — I10 Essential (primary) hypertension: Secondary | ICD-10-CM | POA: Insufficient documentation

## 2018-07-20 DIAGNOSIS — S39012A Strain of muscle, fascia and tendon of lower back, initial encounter: Secondary | ICD-10-CM | POA: Insufficient documentation
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
## 2018-07-20 NOTE — ED Notes (Signed)
Pt ambulatory from wheelchair to bed without issue.

## 2018-07-21 LAB — URINALYSIS, COMPLETE (UACMP) WITH MICROSCOPIC
Bacteria, UA: NONE SEEN
Bilirubin Urine: NEGATIVE
Glucose, UA: NEGATIVE mg/dL
Hgb urine dipstick: NEGATIVE
KETONES UR: 5 mg/dL — AB
Leukocytes, UA: NEGATIVE
Nitrite: NEGATIVE
Protein, ur: NEGATIVE mg/dL
Specific Gravity, Urine: 1.018 (ref 1.005–1.030)
Squamous Epithelial / LPF: NONE SEEN (ref 0–5)
pH: 5 (ref 5.0–8.0)

## 2018-07-21 MED ORDER — TRAMADOL HCL 50 MG PO TABS: 50.0000 mg | ORAL_TABLET | Freq: Two times a day (BID) | ORAL | 0 refills | Status: AC | PRN

## 2018-07-21 NOTE — Discharge Instructions (Signed)
Follow-up with your regular doctor if not better in 3 to 5 days.  Return emergency department if worsening.  You have been given a prescription for tramadol use this sparingly.  Take Tylenol and if you are still having pain you may take the tramadol.  Be careful when walking and standing up quickly with this medication due to the chance of falls.
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
## 2024-02-21 DEATH — deceased
